# Patient Record
Sex: Male | Born: 2000 | Race: White | Hispanic: No | Marital: Single | Smoking: Never smoker
Health system: Southern US, Community
[De-identification: ages and names within clinical notes are randomized; demographics above are authoritative.]

## PROBLEM LIST (undated history)

## (undated) ENCOUNTER — Ambulatory Visit (HOSPITAL_COMMUNITY): Payer: Self-pay

## (undated) ENCOUNTER — Ambulatory Visit (HOSPITAL_COMMUNITY): Discharge status: Absent without leave | Payer: Medicaid Other

## (undated) DIAGNOSIS — F909 Attention-deficit hyperactivity disorder, unspecified type: Secondary | ICD-10-CM

## (undated) DIAGNOSIS — K219 Gastro-esophageal reflux disease without esophagitis: Secondary | ICD-10-CM

## (undated) HISTORY — PX: TONSILLECTOMY: SUR1361

## (undated) HISTORY — PX: WISDOM TOOTH EXTRACTION: SHX21

---
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
## 2021-09-18 ENCOUNTER — Encounter (HOSPITAL_COMMUNITY): Payer: Self-pay

## 2021-09-18 ENCOUNTER — Emergency Department (HOSPITAL_COMMUNITY)
Admission: EM | Admit: 2021-09-18 | Discharge: 2021-09-18 | Disposition: A | Discharge status: Home / Self Care | Payer: Medicaid Other | Attending: Emergency Medicine | Admitting: Emergency Medicine

## 2021-09-18 DIAGNOSIS — Z5321 Procedure and treatment not carried out due to patient leaving prior to being seen by health care provider: Secondary | ICD-10-CM | POA: Insufficient documentation

## 2021-09-18 DIAGNOSIS — H9202 Otalgia, left ear: Secondary | ICD-10-CM | POA: Insufficient documentation

## 2021-09-18 NOTE — ED Notes (Signed)
No response x2 ?

## 2021-09-18 NOTE — ED Triage Notes (Signed)
Pt reports 3 days of left ear pain and difficulty hearing with some congestion, denies fever or chills.
